# Patient Record
Sex: Male | Born: 1960 | Race: Black or African American | Hispanic: No | Marital: Married
Health system: Southern US, Community
[De-identification: ages and names within clinical notes are randomized; demographics above are authoritative.]

---

## 2001-12-13 ENCOUNTER — Encounter: Payer: Self-pay | Admitting: Internal Medicine

## 2001-12-13 ENCOUNTER — Encounter: Admission: RE | Admit: 2001-12-13 | Discharge: 2001-12-13 | Payer: Self-pay | Admitting: Internal Medicine

## 2002-01-07 ENCOUNTER — Encounter: Admission: RE | Admit: 2002-01-07 | Discharge: 2002-01-07 | Payer: Self-pay | Admitting: Internal Medicine

## 2002-01-07 ENCOUNTER — Encounter: Payer: Self-pay | Admitting: Internal Medicine

## 2003-11-11 ENCOUNTER — Emergency Department (HOSPITAL_COMMUNITY): Admission: EM | Admit: 2003-11-11 | Discharge: 2003-11-11 | Payer: Self-pay | Admitting: Emergency Medicine

## 2005-03-04 ENCOUNTER — Observation Stay (HOSPITAL_COMMUNITY): Admission: EM | Admit: 2005-03-04 | Discharge: 2005-03-05 | Payer: Self-pay | Admitting: Emergency Medicine

## 2005-03-28 ENCOUNTER — Encounter (INDEPENDENT_AMBULATORY_CARE_PROVIDER_SITE_OTHER): Payer: Self-pay | Admitting: Cardiology

## 2005-03-28 ENCOUNTER — Ambulatory Visit (HOSPITAL_COMMUNITY): Admission: RE | Admit: 2005-03-28 | Discharge: 2005-03-28 | Payer: Self-pay | Admitting: Internal Medicine

## 2007-05-02 ENCOUNTER — Inpatient Hospital Stay (HOSPITAL_COMMUNITY): Admission: EM | Admit: 2007-05-02 | Discharge: 2007-05-04 | Payer: Self-pay | Admitting: Emergency Medicine

## 2007-05-02 ENCOUNTER — Ambulatory Visit: Payer: Self-pay | Admitting: Cardiology

## 2007-05-03 ENCOUNTER — Encounter (INDEPENDENT_AMBULATORY_CARE_PROVIDER_SITE_OTHER): Payer: Self-pay | Admitting: *Deleted

## 2007-05-15 ENCOUNTER — Ambulatory Visit: Payer: Self-pay | Admitting: Cardiology

## 2009-01-04 IMAGING — CR DG CHEST 1V PORT
1 series · 1 of 1 positions shown · non-contrast
Comparison: 03/04/05

CLINICAL DATA: Heart palpitations. 
 PORTABLE CHEST - 1 VIEW ? 05/02/07 AT 6489 HOURS:

[AP]
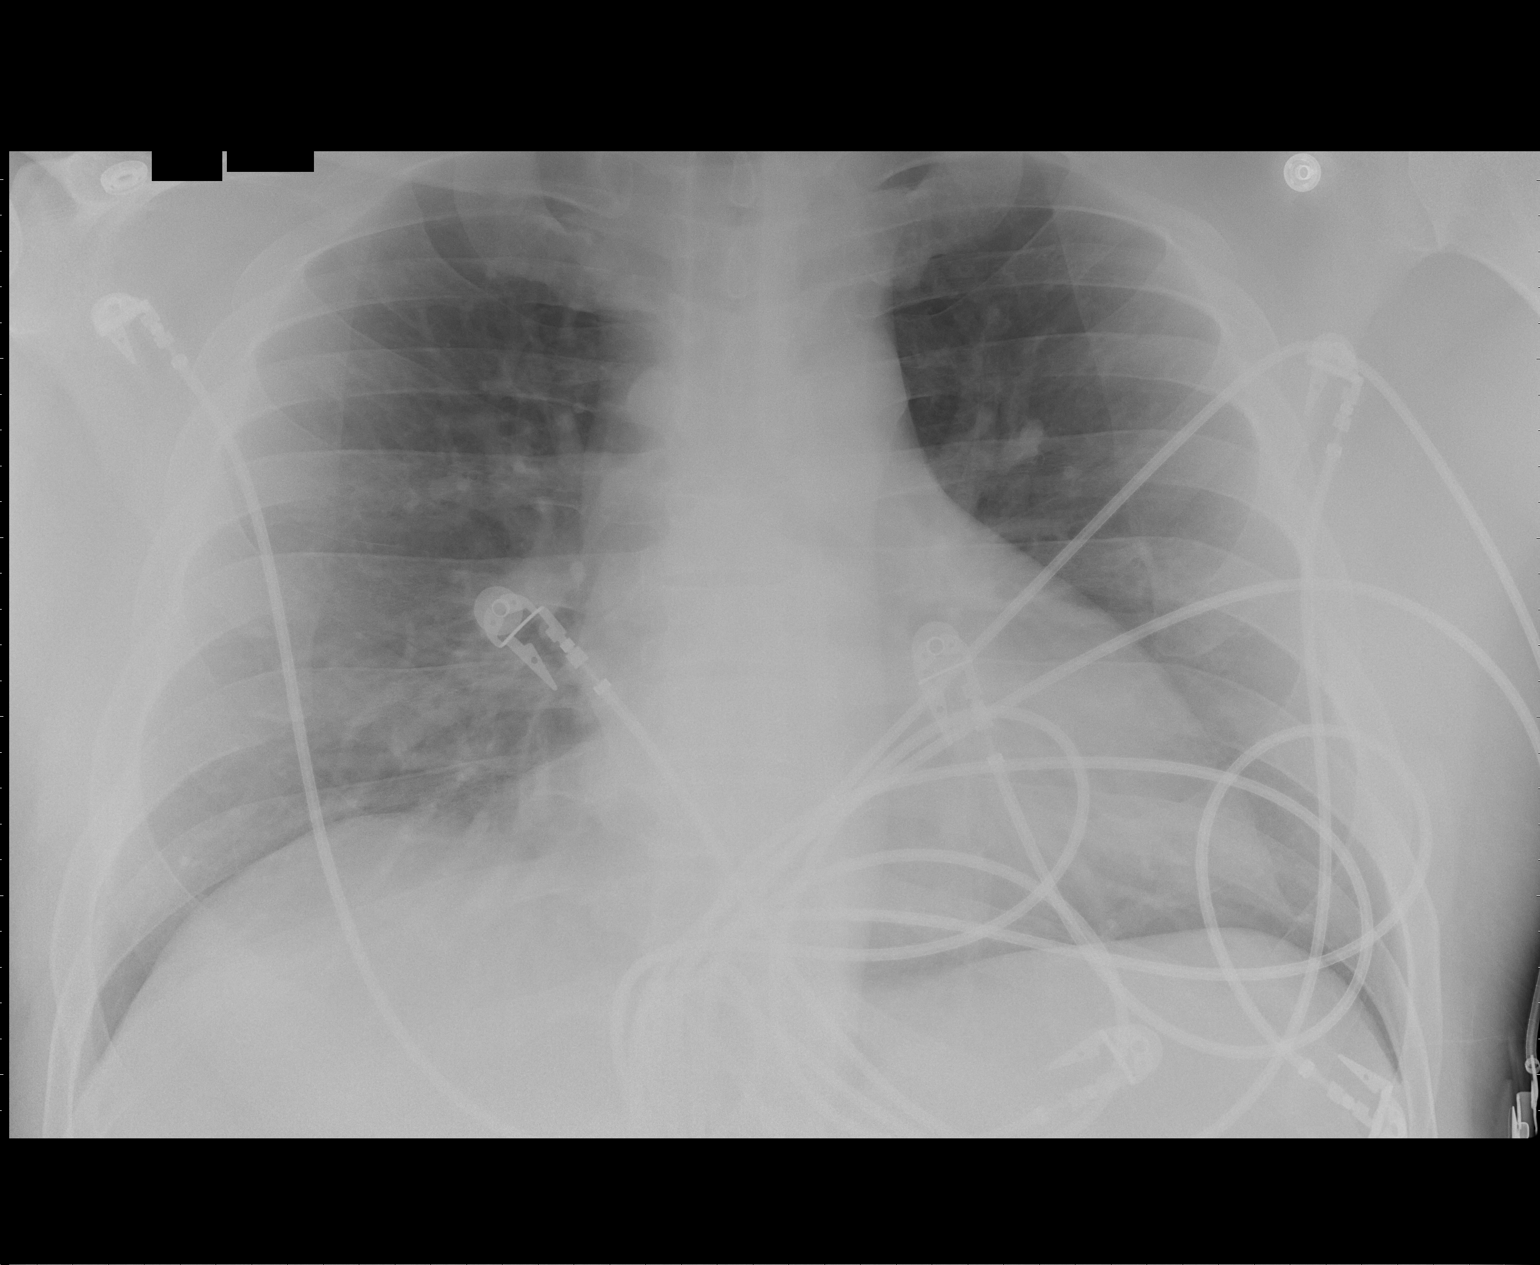

[1 of 1 positions shown; findings below may reference images not displayed]

FINDINGS: The heart is enlarged. There is vascular congestion without edema. There is no infiltrate or effusion.
IMPRESSION: Cardiac enlargement with mild vascular congestion.

## 2009-03-17 ENCOUNTER — Observation Stay (HOSPITAL_COMMUNITY): Admission: EM | Admit: 2009-03-17 | Discharge: 2009-03-18 | Payer: Self-pay | Admitting: Emergency Medicine

## 2009-03-24 ENCOUNTER — Encounter: Payer: Self-pay | Admitting: Cardiology

## 2009-04-02 DIAGNOSIS — E785 Hyperlipidemia, unspecified: Secondary | ICD-10-CM | POA: Insufficient documentation

## 2009-04-02 DIAGNOSIS — I4891 Unspecified atrial fibrillation: Secondary | ICD-10-CM | POA: Insufficient documentation

## 2009-04-02 DIAGNOSIS — C61 Malignant neoplasm of prostate: Secondary | ICD-10-CM

## 2009-04-14 ENCOUNTER — Ambulatory Visit: Payer: Self-pay | Admitting: Cardiology

## 2009-04-14 ENCOUNTER — Encounter: Payer: Self-pay | Admitting: Physician Assistant

## 2009-04-14 ENCOUNTER — Telehealth: Payer: Self-pay | Admitting: Cardiology

## 2009-04-14 ENCOUNTER — Telehealth (INDEPENDENT_AMBULATORY_CARE_PROVIDER_SITE_OTHER): Payer: Self-pay | Admitting: *Deleted

## 2009-04-14 DIAGNOSIS — I10 Essential (primary) hypertension: Secondary | ICD-10-CM

## 2009-04-15 ENCOUNTER — Encounter (HOSPITAL_COMMUNITY): Admission: RE | Admit: 2009-04-15 | Discharge: 2009-06-23 | Payer: Self-pay | Admitting: Cardiology

## 2009-04-15 ENCOUNTER — Ambulatory Visit: Payer: Self-pay

## 2009-04-15 ENCOUNTER — Ambulatory Visit: Payer: Self-pay | Admitting: Cardiology

## 2009-04-15 ENCOUNTER — Encounter: Payer: Self-pay | Admitting: Internal Medicine

## 2009-04-16 ENCOUNTER — Telehealth: Payer: Self-pay | Admitting: Cardiology

## 2009-04-19 ENCOUNTER — Inpatient Hospital Stay (HOSPITAL_COMMUNITY): Admission: RE | Admit: 2009-04-19 | Discharge: 2009-04-20 | Payer: Self-pay | Admitting: Urology

## 2009-04-19 ENCOUNTER — Encounter (INDEPENDENT_AMBULATORY_CARE_PROVIDER_SITE_OTHER): Payer: Self-pay | Admitting: Urology

## 2009-05-07 ENCOUNTER — Encounter: Payer: Self-pay | Admitting: Cardiology

## 2009-05-11 ENCOUNTER — Ambulatory Visit: Payer: Self-pay | Admitting: Cardiology

## 2009-05-11 DIAGNOSIS — E669 Obesity, unspecified: Secondary | ICD-10-CM

## 2010-04-21 NOTE — Assessment & Plan Note (Signed)
Summary: Cardiology Nuclear Study  Nuclear Med Background Indications for Stress Test: Evaluation for Ischemia, Surgical Clearance  Indications Comments: Pending robotic prostatectomy on 04/19/09 by Dr.Lester Laverle Patter  History: Echo, GXT  History Comments: '09 Echo:EF= 55-60%; 10/10 GXT:OK per patient; h/o PAF  Symptoms: Chest Tightness  Symptoms Comments: Last episode of ZO:XWRUEAVWU.   Nuclear Pre-Procedure Cardiac Risk Factors: History of Smoking, Lipids, NIDDM, Obesity Caffeine/Decaff Intake: None NPO After: 11:00 PM Lungs: Clear IV 0.9% NS with Angio Cath: 20g     IV Site: (L) AC IV Started by: Irean Hong RN Chest Size (in) 52     Height (in): 73.5 Weight (lb): 268 BMI: 35.00 Tech Comments: Quit smoking 03/20/09. No medications taken today.  Nuclear Med Study 1 or 2 day study:  1 day     Stress Test Type:  Stress Reading MD:  Olga Millers, MD     Referring MD:  Rollene Rotunda, MD; JW:JXBJYN Laverle Patter, MD, Larey Dresser, MD Resting Radionuclide:  Technetium 27m Tetrofosmin     Resting Radionuclide Dose:  11.0 mCi  Stress Radionuclide:  Technetium 84m Tetrofosmin     Stress Radionuclide Dose:  33.0 mCi   Stress Protocol Exercise Time (min):  5:30 min     Max HR:  142 bpm     Predicted Max HR:  174 bpm  Max Systolic BP: >243 mm Hg     Percent Max HR:  81.61 %     METS: 7.0 Rate Pressure Product:  0    Stress Test Technologist:  Rea [employer] CMA-N     Nuclear Technologist:  Burna Mortimer Deal RT-N  Rest Procedure  Myocardial perfusion imaging was performed at rest 45 minutes following the intravenous administration of Myoview Technetium 28m Tetrofosmin.  Stress Procedure  The patient exercised for 5:30.  The patient stopped due to a hypertensive response, >243/80; he did not take any of his medications this morning. He denied any chest pain.  The EKG was nondiagnostic due to baseline changes.  Myoview was injected at peak exercise and myocardial perfusion imaging was performed  after a brief delay.  QPS Raw Data Images:  Acuisition technically good; normal left ventricular size. Stress Images:  There is normal uptake in all areas. Rest Images:  Normal homogeneous uptake in all areas of the myocardium. Subtraction (SDS):  No evidence of ischemia. Transient Ischemic Dilatation:  1.06  (Normal <1.22)  Lung/Heart Ratio:  .32  (Normal <0.45)  Quantitative Gated Spect Images QGS EDV:  118 ml QGS ESV:  52 ml QGS EF:  56 % QGS cine images:  Normal wall motion.   Overall Impression  Exercise Capacity: Fair exercise capacity. BP Response: Hypertensive blood pressure response. Clinical Symptoms: No chest pain ECG Impression: No significant ST segment change suggestive of ischemia. Overall Impression: There is no sign of scar or ischemia.  Appended Document: Cardiology Nuclear Study NL.  Patient is at acceptable risk for the planned surgery.  Appended Document: Cardiology Nuclear Study talked with patient--talked with Larita Fife at Dr Rolly Salter office

## 2010-04-21 NOTE — Progress Notes (Signed)
Summary: Nuclear pre-procedure  Phone Note Outgoing Call   Call placed by: Milana Na, EMT-P,  April 14, 2009 1:23 PM Summary of Call: Left message with information on Myoview Information Sheet (see scanned document for details).         Nuclear Med Background Indications for Stress Test: Evaluation for Ischemia, Surgical Clearance  Indications Comments: Pending robotic prostatectomy Dr.Lester Borden  History: Echo  History Comments: '09 ECHO 55-60%     Nuclear Pre-Procedure Cardiac Risk Factors: Lipids, NIDDM  Nuclear Med Study Referring MD:  J.Hochrein

## 2010-04-21 NOTE — Assessment & Plan Note (Signed)
Summary: 1 month rov/sl   Visit Type:  Follow-up Primary Provider:  Dr. Zenaida Deed  CC:  HTN.  History of Present Illness: The patient presents for followup of his blood pressure. At the last visit we saw him preoperatively prior to getting prostatectomy and on the local hernia repair. He had a stress perfusion study which demonstrated no evidence of ischemia or infarct. However, he had a very dramatic blood pressure rise with exercise. He did well with his surgeries in recovery. Unfortunately he is not taking his [place]. His blood pressure is elevated as listed below. He does not like taking medications. He does not want to be tied to these prominently.  He would rather take over-the-counter therapies and lose weight. He is not having any cardiovascular symptoms and denies any chest pressure, neck or arm discomfort. He has had no palpitations, presyncope or syncope. He has had no PND or orthopnea. He has gained weight since I last saw him.  Current Medications (verified): 1)  Simvastatin 20 Mg Tabs (Simvastatin) .... Hold 2)  Xanax 0.5 Mg Tabs (Alprazolam) .... Take One As Needed 3)  Aspirin 81 Mg Tbec (Aspirin) .... Take One Daily 4)  Cartia Xt 120 Mg Xr24h-Cap (Diltiazem Hcl Coated Beads) .... Hold  Allergies (verified): No Known Drug Allergies  Past History:  Past Medical History: Reviewed history from 04/02/2009 and no changes required.  1. Atrial fibrillation.   2. Prostate cancer.   3. Hyperlipidemia.   4. Anxiety disorder.   Past Surgical History: Prostatectomy, Umbilical hernia repair  Review of Systems       As stated in the HPI and negative for all other systems.   Vital Signs:  Patient profile:   50 year old male Height:      73.5 inches Weight:      275 pounds BMI:     35.92 Pulse rate:   101 / minute Resp:     16 per minute BP sitting:   173 / 97  (right arm)  Vitals Entered By: Marrion Coy, CNA (May 11, 2009 2:30 PM)  Physical Exam  General:   Well developed, well nourished, in no acute distress. Head:  normocephalic and atraumatic Eyes:  PERRLA/EOM intact; conjunctiva and lids normal. Mouth:  Teeth, gums and palate normal. Oral mucosa normal. Neck:  Neck supple, no JVD. No masses, thyromegaly or abnormal cervical nodes. Chest Wall:  no deformities or breast masses noted Lungs:  Clear bilaterally to auscultation and percussion. Abdomen:  Bowel sounds positive; abdomen soft and non-tender without masses, organomegaly, or hernias noted. No hepatosplenomegaly. Msk:  Back normal, normal gait. Muscle strength and tone normal. Extremities:  No clubbing or cyanosis. Neurologic:  Alert and oriented x 3. Skin:  Intact without lesions or rashes. Cervical Nodes:  no significant adenopathy Inguinal Nodes:  no significant adenopathy Psych:  Normal affect.   Detailed Cardiovascular Exam  Neck    Carotids: Carotids full and equal bilaterally without bruits.      Neck Veins: Normal, no JVD.    Heart    Inspection: no deformities or lifts noted.      Palpation: normal PMI with no thrills palpable.      Auscultation: regular rate and rhythm, S1, S2 without murmurs, rubs, gallops, or clicks.    Vascular    Abdominal Aorta: no palpable masses, pulsations, or audible bruits.      Femoral Pulses: normal femoral pulses bilaterally.      Pedal Pulses: normal pedal pulses bilaterally.  Radial Pulses: normal radial pulses bilaterally.      Peripheral Circulation: no clubbing, cyanosis, or edema noted with normal capillary refill.     Impression & Recommendations:  Problem # 1:  HYPERTENSION, BENIGN (ICD-401.1) I had a long discussion with him about the risk of uncontrolled blood pressure. We discussed at length the lifestyle changes that can improve his blood pressure. However, I think I have convinced him that at least for now he also needs medications. He agrees to restart the [place]. He agrees to get a blood pressure cuff so that we  can further understand how well controlled his pressure is. In the meantime he will limit alcohol salt and lose weight.  Problem # 2:  OBESITY, UNSPECIFIED (ICD-278.00) As above he understands the need to lose weight.  Problem # 3:  ATRIAL FIBRILLATION (ICD-427.31) He has had no symptomatic recurrence of this. No further testing is indicated.  Patient Instructions: 1)  Your physician recommends that you schedule a follow-up appointment in: 4 months with Dr Antoine Poche 2)  Your physician recommends that you continue on your current medications as directed. Please refer to the Current Medication list given to you today. 3)  You have been diagnosed with atrial fibrillation.  Atrial fibrillation is a condition in which one of the upper chambers of the heart has extra electrical cells causing it to beat very fast.  Please see the handout/brochure given to you today for further information. Prescriptions: CARTIA XT 120 MG XR24H-CAP (DILTIAZEM HCL COATED BEADS) HOLD  #30 x 11   Entered by:   Charolotte Capuchin, RN   Authorized by:   Rollene Rotunda, MD, [facility]   Signed by:   Charolotte Capuchin, RN on 05/11/2009   Method used:   Electronically to        [facility] (retail)       [address]       [place], [place]  [address]       Ph: [phone]       Fax: [phone]   RxID:   [id number] SIMVASTATIN 20 MG TABS (SIMVASTATIN) HOLD  #30 x 11   Entered by:   Charolotte Capuchin, RN   Authorized by:   Rollene Rotunda, MD, [facility]   Signed by:   Charolotte Capuchin, RN on 05/11/2009   Method used:   Electronically to        [facility] (retail)       [address]       [place], [place]  [address]       Ph: [phone]       Fax: [phone]   RxID:   [id number]

## 2010-04-21 NOTE — Miscellaneous (Signed)
  Clinical Lists Changes  Observations: Added new observation of NUCLEAR NOS: Exercise Capacity: Fair exercise capacity. BP Response: Hypertensive blood pressure response. Clinical Symptoms: No chest pain ECG Impression: No significant ST segment change suggestive of ischemia. Overall Impression: There is no sign of scar or ischemia.  (04/15/2009 13:49)      Nuclear Study  Procedure date:  04/15/2009  Findings:      Exercise Capacity: Fair exercise capacity. BP Response: Hypertensive blood pressure response. Clinical Symptoms: No chest pain ECG Impression: No significant ST segment change suggestive of ischemia. Overall Impression: There is no sign of scar or ischemia.

## 2010-04-21 NOTE — Letter (Signed)
Summary: [facility] Office Note  [facility] Office Note   Imported By: Roderic Ovens 04/20/2009 11:25:27  _____________________________________________________________________  External Attachment:    Type:   Image     Comment:   External Document

## 2010-04-21 NOTE — Progress Notes (Signed)
Summary: surgical clearance  Phone Note From Other Clinic   Caller: nurse Larita Fife Summary of Call: pt needs clearance for surgery. pt had stress test yesterday. ofc [id number] [phone] fax [phone] Initial call taken by: Edman Circle,  April 16, 2009 9:27 AM  Follow-up for Phone Call        myoview done 04-15-09--will review with Dr Bernette Redbird) Katina Dung, RN, BSN  April 16, 2009 9:42 AM   Additional Follow-up for Phone Call Additional follow up Details #1::        Patient is cleared for surgery. talked with Larita Fife at Dr Konrad Dolores Borden's  office [phone]--faxed 04-15-09  myoview with Dr Jorgeluis Gurganus's comments and office note 04-14-09 by Jacolyn Reedy  to Dr Laverle Patter

## 2010-04-21 NOTE — Assessment & Plan Note (Signed)
Summary: surgical clearacne/prostate cancer/jml   Visit Type:  Pre-op Evaluation  CC:  no complaints.  History of Present Illness: This is a 50 year old, African American male, patient, who is here for presurgical clearance. He is scheduled to undergo prostatectomy next Monday. he is already scheduled for a stress Myoview tomorrow.  The patient was seen here 2 years ago after an episode of paroxysmal atrial fibrillation. This was brought on by emotional stress. He had one other episode that occurred under extreme physical activity. He was placed on Cartia. The patient never followed up and took this medication for a very short time.  In December. The patient was shoveling his neighbors driveway and became short of breath. He thought he was going back into atrial fibrillation and actually went to the emergency room. Discharge summary also states he had atypical chest pain. Cardiac enzymes were normal and he was in normal sinus rhythm.  The patient is treated for hyperlipidemia, but has had elevated blood pressures recently that have not been treated. The patient does not like to take chronic medications and feels he can get his blood pressure down. If he starts an exercise program and lose weight. He did quit smoking cigarettes on March 21, 1999.  The patient denies any further chest pain, palpitations, dyspnea, dyspnea on exertion, dizziness, or presyncope.  Current Medications (verified): 1)  Simvastatin 20 Mg Tabs (Simvastatin) .... Take One Daily 2)  Xanax 0.5 Mg Tabs (Alprazolam) .... Take One As Needed 3)  Aspirin 81 Mg Tbec (Aspirin) .... Take One Daily  Past History:  Past Medical History: Last updated: 04/02/2009  1. Atrial fibrillation.   2. Prostate cancer.   3. Hyperlipidemia.   4. Anxiety disorder.   Past Surgical History: Last updated: 04/02/2009  The patient has no history of surgeries.  Family History: Reviewed history from 04/02/2009 and no changes  required. The patient's grandfather had coronary artery disease.    he does not know his father's history Mother had breast cancer, but no cardiac problem  Social History: Reviewed history from 04/02/2009 and no changes required. The patient is a smoker, has been smoking 1 pack per   day for the last 18 years.  Patient quit smoking March 20, 2009 Only drinks 3 to 4 beers a week.  No history   of illicit drug abuse  Review of Systems       See history of present illness  Vital Signs:  Patient profile:   50 year old male Height:      73.5 inches Weight:      268 pounds BMI:     35.00 Pulse rate:   80 / minute Pulse rhythm:   regular BP sitting:   148 / 100  (left arm)  Vitals Entered By: Jacquelin Hawking, CMA (April 14, 2009 11:59 AM)  Physical Exam  General:   Well-nournished, in no acute distress. Neck: No JVD, HJR, Bruit, or thyroid enlargement Lungs: No tachypnea, clear without wheezing, rales, or rhonchi Cardiovascular: RRR, PMI not displaced, heart sounds normal, no murmurs, gallops, bruit, thrill, or heave. Abdomen: BS normal. Soft without organomegaly, masses, lesions or tenderness. Extremities: without cyanosis, clubbing or edema. Good distal pulses bilateral SKin: Warm, no lesions or rashes  Musculoskeletal: No deformities Neuro: no focal signs    EKG  Procedure date:  04/14/2009  Findings:      normal sinus rhythm with nonspecific ST-T wave changes. No acute change  Impression & Recommendations:  Problem # 1:  PRE-OPERATIVE CARDIOVASCULAR EXAMINATION (  ICD-V72.81) Patient has history of 2 episodes of it or fibrillation. He has not been maintained on [place]. Cardiac risk factors are significant for untreated hypertension, treated, hyperlipidemia, long-time smoker, but quit March 20, 2009, no history of diabetes mellitus or immediate family history of coronary artery disease. I discussed this patient with Dr. Charlies Constable.he agrees we should resume cartia  120 mg daily today for blood pressure control and he is scheduled for a stress Myoview tomorrow. If this is normal. He can proceed with surgery. His updated medication list for this problem includes:    Aspirin 81 Mg Tbec (Aspirin) .Marland Kitchen... Take one daily    Cartia Xt 120 Mg Xr24h-cap (Diltiazem hcl coated beads) .Marland Kitchen... Take one tablet by mouth daily  Orders: EKG w/ Interpretation (93000)  Problem # 2:  ATRIAL FIBRILLATION (ICD-427.31) Patient is in normal sinus rhythm today. Nancie Neas has been resumed. His updated medication list for this problem includes:    Aspirin 81 Mg Tbec (Aspirin) .Marland Kitchen... Take one daily  Problem # 3:  HYPERTENSION, BENIGN (ICD-401.1) we started Cartia His updated medication list for this problem includes:    Aspirin 81 Mg Tbec (Aspirin) .Marland Kitchen... Take one daily    Cartia Xt 120 Mg Xr24h-cap (Diltiazem hcl coated beads) .Marland Kitchen... Take one tablet by mouth daily  Problem # 4:  PROSTATE CANCER (ICD-185) Patient scheduled for surgery Monday  Problem # 5:  HYPERLIPIDEMIA (ICD-272.4) Treated by his primary care physician His updated medication list for this problem includes:    Simvastatin 20 Mg Tabs (Simvastatin) .Marland Kitchen... Take one daily  Patient Instructions: 1)  Your physician recommends that you schedule a follow-up appointment in: 1 month with Dr. Antoine Poche. 2)  Your physician has requested that you limit the intake of sodium (salt) in your diet to two grams daily. Please see [facility] handout. 3)  Your physician has recommended you make the following change in your medication: New medication: Cartia 120 mg take one tablet by mouth once a day. Prescriptions: CARTIA XT 120 MG XR24H-CAP (DILTIAZEM HCL COATED BEADS) take one tablet by mouth daily  #30 x 6   Entered by:   Ollen Gross, RN, BSN   Authorized by:   Marletta Lor, PA-C   Signed by:   Ollen Gross, RN, BSN on 04/14/2009   Method used:   Electronically to        [facility] (retail)       [address]       [place], [place]  [address]       Ph: [phone]       Fax: [phone]   RxID:   [id number]

## 2010-04-21 NOTE — Progress Notes (Signed)
Summary: has some questions   Phone Note Call from Patient Call back at Home Phone [phone]   Caller: Patient Reason for Call: Talk to Nurse Summary of Call: has med question for Herma Carson Initial call taken by: Migdalia Dk,  April 14, 2009 3:12 PM  Follow-up for Phone Call        [facility] Ollen Gross, RN, BSN  April 15, 2009 9:48 AM LMOM TO CALL BACK PAN FLEMING-HAYES,RN NEEDED TO KNOW IF HE SHOULD HAVE TAKEN CARTIA BEFORE HIS STRESS TEST.  HAD IT THIS AM Follow-up by: Charolotte Capuchin, RN,  April 15, 2009 11:28 AM

## 2010-06-06 LAB — BASIC METABOLIC PANEL
BUN: 14 mg/dL (ref 6–23)
CO2: 29 mEq/L (ref 19–32)
Chloride: 101 mEq/L (ref 96–112)
Creatinine, Ser: 1.12 mg/dL (ref 0.4–1.5)
Potassium: 4 mEq/L (ref 3.5–5.1)

## 2010-06-06 LAB — CBC
HCT: 48.8 % (ref 39.0–52.0)
MCHC: 32.7 g/dL (ref 30.0–36.0)
MCV: 86.6 fL (ref 78.0–100.0)
Platelets: 204 10*3/uL (ref 150–400)
RBC: 5.64 MIL/uL (ref 4.22–5.81)

## 2010-06-06 LAB — HEMOGLOBIN AND HEMATOCRIT, BLOOD: Hemoglobin: 15.3 g/dL (ref 13.0–17.0)

## 2010-06-06 LAB — ABO/RH: ABO/RH(D): A POS

## 2010-06-09 LAB — BASIC METABOLIC PANEL
BUN: 8 mg/dL (ref 6–23)
Calcium: 8.4 mg/dL (ref 8.4–10.5)
Chloride: 104 mEq/L (ref 96–112)
Creatinine, Ser: 1.36 mg/dL (ref 0.4–1.5)
GFR calc Af Amer: >60 mL/min (ref >60)
GFR calc non Af Amer: 56 mL/min — ABNORMAL LOW (ref >60)

## 2010-06-09 LAB — CARDIAC PANEL(CRET KIN+CKTOT+MB+TROPI)
Total CK: 256 U/L — ABNORMAL HIGH (ref 7–232)
Troponin I: 0.01 ng/mL (ref 0.00–0.06)

## 2010-06-09 LAB — HEMOGLOBIN AND HEMATOCRIT, BLOOD
HCT: 41.9 % (ref 39.0–52.0)
Hemoglobin: 13.9 g/dL (ref 13.0–17.0)

## 2010-06-20 LAB — COMPREHENSIVE METABOLIC PANEL
AST: 21 U/L (ref 0–37)
Albumin: 3.5 g/dL (ref 3.5–5.2)
BUN: 11 mg/dL (ref 6–23)
Calcium: 9.1 mg/dL (ref 8.4–10.5)
Creatinine, Ser: 1.02 mg/dL (ref 0.4–1.5)
GFR calc Af Amer: >60 mL/min (ref >60)
Total Bilirubin: 0.4 mg/dL (ref 0.3–1.2)
Total Protein: 6 g/dL (ref 6.0–8.3)

## 2010-06-20 LAB — CK TOTAL AND CKMB (NOT AT ARMC)
CK, MB: 4.3 ng/mL — ABNORMAL HIGH (ref 0.3–4.0)
CK, MB: 4.4 ng/mL — ABNORMAL HIGH (ref 0.3–4.0)
Relative Index: 2.1 (ref 0.0–2.5)
Relative Index: 2.2 (ref 0.0–2.5)
Relative Index: 2.4 (ref 0.0–2.5)
Total CK: 196 U/L (ref 7–232)
Total CK: 201 U/L (ref 7–232)

## 2010-06-20 LAB — DIFFERENTIAL
Basophils Absolute: 0 10*3/uL (ref 0.0–0.1)
Eosinophils Relative: 0 % (ref 0–5)
Lymphocytes Relative: 24 % (ref 12–46)
Lymphs Abs: 1 10*3/uL (ref 0.7–4.0)
Monocytes Absolute: 0.3 10*3/uL (ref 0.1–1.0)
Neutro Abs: 3 10*3/uL (ref 1.7–7.7)

## 2010-06-20 LAB — CBC
HCT: 45.6 % (ref 39.0–52.0)
HCT: 46.8 % (ref 39.0–52.0)
Hemoglobin: 15.6 g/dL (ref 13.0–17.0)
MCHC: 33.2 g/dL (ref 30.0–36.0)
MCHC: 34.3 g/dL (ref 30.0–36.0)
MCV: 87.1 fL (ref 78.0–100.0)
Platelets: 171 10*3/uL (ref 150–400)
RBC: 5.22 MIL/uL (ref 4.22–5.81)
RDW: 14.2 % (ref 11.5–15.5)
WBC: 4.3 10*3/uL (ref 4.0–10.5)

## 2010-06-20 LAB — POCT CARDIAC MARKERS
CKMB, poc: 1.4 ng/mL (ref 1.0–8.0)
CKMB, poc: 1.8 ng/mL (ref 1.0–8.0)
Troponin i, poc: <0.05 ng/mL (ref 0.00–0.09)
Troponin i, poc: <0.05 ng/mL (ref 0.00–0.09)

## 2010-06-20 LAB — TROPONIN I: Troponin I: 0.01 ng/mL (ref 0.00–0.06)

## 2010-08-02 NOTE — H&P (Signed)
NAME:  Chad Maldonado, GRANDFIELD NO.:  [id number]   MEDICAL RECORD NO.:  [id number]          PATIENT TYPE:  INP   LOCATION:  4703                         FACILITY:  [facility]   PHYSICIAN:  Beckey Rutter, MD  DATE OF BIRTH:  1960-10-13   DATE OF ADMISSION:  05/02/2007  DATE OF DISCHARGE:                              HISTORY & PHYSICAL   PRIMARY CARE PHYSICIAN:  Dr. Burton Apley.   CHIEF COMPLAINT:  Palpitation.   HISTORY OF PRESENT ILLNESS:  This is 50 year old, pleasant, African-  American male with past medical history significant for paroxysmal  atrial fibrillation, hypertension and borderline diabetes presented  today with palpitation started since midnight.  The patient is working  third shift, and during his work, he noticed palpitation on and off.  When he got off work this morning, the palpitations came along with some  shortness of breath.  That is when he contacted his doctor and  eventually came to the emergency department.  Other than the shortness  of breath, there is no significant association with this palpitation.  Categorically, the patient denied chest pain, sweating or severe  dizziness.   PAST MEDICAL HISTORY:  1. History of paroxysmal atrial fibrillation with history of      hospitalization on December 2006.  2. Hypertension.  3. Borderline diabetes, now his diabetes controlled with diet and      lifestyle modification.  4. Dyslipidemia.   FAMILY HISTORY:  Diabetes in the father's side.   SOCIAL HISTORY:  The patient is a heavy smoker for some time now.  He  smokes one and a half pack of cigarettes per day.  He denied illicit  drug abuse at this time, and he denied ethanol abuse.   MEDICATION ALLERGIES:  Not known to have medication allergy.   MEDICATION:  He is not taking any medication at this time.   REVIEW OF SYSTEMS:  Shortness of breath, otherwise noncontributory.   PHYSICAL EXAMINATION:  His blood pressure is 151/94, temperature  97.7,  pulse 80, respiratory rate is 20.  On exam, he is alert, oriented x3,  giving history.  HEAD:  Atraumatic, normocephalic.  EYES:  PERRLA.  MOUTH:  Moist.  No  ulcer.  NECK:  Supple.  No JVD.  LUNGS:  Bilateral diminished air entry.  PRECORDIUM:  Distal heart sounds with irregular rhythm.  ABDOMEN:  Soft, nontender.  Bowel sounds present.  EXTREMITIES:  No lower extremity edema.  NEUROLOGICALLY:  Alert, oriented x3, and giving history.   His EKG done today showing ventricular rate of 94 with atrial  fibrillation and nonspecific lateral T-waves and one EKG showing V5/V6  inversion of the T-waves.  The troponin is showing less than 0.05,  myoglobin is 50.3, CK-MB is 1.7.  Creatinine is 1.4.  Hemoglobin is  18.4, hematocrit is 54.  Sodium is 138, potassium 4.4, chloride 109,  glucose 89, BUN is 17.  Chest x-ray showing cardiac enlargement with  vascular congestion and no edema.   ASSESSMENT AND PLAN:  This is a 50 year old, African-American male with  a history of hypertension and history of paroxysmal atrial  fibrillation,  who presented today with what seems to be at atrial fibrillation.  The  patient is not compliant with his medication, though he mentioned he had  some workup done, he could not remember his cardiologist's name.  He  also is not taking medication currently including the Cardizem to  control the rate as well as the hypertension.   1. The patient will be admitted to telemetry floor for further      assessment and management.  2. Will obtain 2-D echo.  3. Will cycle cardiac enzymes, and we will obtain EKG every 8 hours to      rule out acute coronary syndromes.  4. We will check his thyroid function.  5. We will do drug screen.  6. I will start the patient on Cardizem drip for rate control and will      try to change it to Cardizem p.o. if the rate is controlled by the      drip.  7. We will check his lipid profile.  8. We will check his hemoglobin A1c and  will continue him on CBG for      further glycemic control.  9. The patient is counseled for his tobacco abuse, and he thinks it is      about time for him to quit.  We will provide the patient with      smoking cessation materials during hospitalization.  10.The patient will be started on Lovenox therapeutic dose until the      rest of the workup comes back and until he is ruled out.  11.For GI prophylaxis, I will start the patient on Protonix.  I will      have low threshold for cardiology consultation depending on the      outcome of the result on the workup.      Beckey Rutter, MD  Electronically Signed     EME/MEDQ  D:  05/02/2007  T:  05/04/2007  Job:  [id number]   cc:   Burton Apley, M.D.

## 2010-08-02 NOTE — Discharge Summary (Signed)
NAME:  ROCHELLE, NEPHEW NO.:  [id number]   MEDICAL RECORD NO.:  [id number]          PATIENT TYPE:  INP   LOCATION:  4703                         FACILITY:  [facility]   PHYSICIAN:  Beckey Rutter, MD  DATE OF BIRTH:  [birth year]   DATE OF ADMISSION:  05/02/2007  DATE OF DISCHARGE:  05/04/2007                               DISCHARGE SUMMARY   PRIMARY CARE PHYSICIAN:  Antony Madura, M.D.   CHIEF COMPLAINT AND HISTORY OF PRESENT ILLNESS:  This is 50 year old  pleasant African-American male with past medical history significant for  paroxysmal atrial fibrillation presented with palpitations, and found to  be in atrial fibrillation with rapid ventricular response.  Please refer  to the full H&P dictated on the day of admission for further details.   HOSPITAL COURSE:  Problem #1:  During hospitalization the patient was  admitted to telemetry floor, and he was ruled out for acute coronary  syndromes.  He had his thyroid function checked.  The patient his been  stable for the last 24-hour on monitoring.  He converted to sinus after  Cardizem, orally, was started for rate control, and he remained in  sinus.  The patient had an appointment scheduled with the cardiology  service on May 15, 2007.   Problem #3:  Smoking cessation.  The patient was counseled, and he  stated that he is not quite ready to totally quit, but he is really  going to put some effort towards smoking cessation.  The smoking  cessation material will be provided to him before in his discharge.   DISCHARGE DIAGNOSES:  1. Paroxysmal atrial fibrillation likely low on antral fibrillation  2. Borderline hypertension.  3. Ongoing tobacco abuse.  4. Dyslipidemia.   DISCHARGE MEDICATIONS:  1. Cardizem 120 mg continuous release daily.  2. Aspirin 325 mg daily.   HOSPITAL PROCEDURES:  1. A 2-D echo done on the May 03, 2007.  Summary reading:      Overall left ventricular systolic function was  normal.  Left      ventricular ejection fraction was estimated range between 55-60%.      There was no diagnostic evidence of left ventricular regional wall      motion abnormalities.  2. Left ventricular wall thickness was mildly increased, and the left      atrium was mildly dilated.  Hemoglobin A1c on May 02, 2007,      was 6.4.  TSH is 1.6.  Lipid profile done on May 02, 2007:      Cholesterol is 222, triglyceride 93, HDL 36, LDL is 167.  Drug      screen is negative except for tetrahydrocannabinol.   DISCHARGE/PLAN:  The patient will be discharged to follow up with his  primary physician Dr. Su Hilt.  He also was recommended to follow up  with cardiology as discussed with him.  He did not want to start a small  dose of  statins for his cholesterol; and I agreed to leave him without  medication at this time, especially when he is thinking on focusing on  lifestyle modification.  The patient has a borderline blood pressure  during admission time, and I would continue him on Cardizem orally, to  be reevaluated as per the primary physician's discretion.      Beckey Rutter, MD  Electronically Signed     EME/MEDQ  D:  05/04/2007  T:  05/06/2007  Job:  [id number]   cc:   Antony Madura, M.D.

## 2010-08-02 NOTE — Assessment & Plan Note (Signed)
[facility]                            CARDIOLOGY OFFICE NOTE   NAME:WRIGHTVern, Guerette                     MRN:          [id number]  DATE:05/15/2007                            DOB:          [birth year]    PRIMARY CARE PHYSICIAN:  Dr. Zenaida Deed.   REASON FOR PRESENTATION:  Evaluate this patient with atrial  fibrillation.   HISTORY OF PRESENT ILLNESS:  This patient is a 50 year old gentleman  with a history of atrial fibrillation.  This was first documented in  2006.  He says he has had three episodes over that time.  The last of  these was February 12.  He feels his heart racing and cannot get his  breath.  He was admitted on February 12.  He received some Cardizem and  converted to sinus rhythm.  There were no ischemic EKG changes.  No  enzyme elevations.  He did have an echocardiogram that demonstrated well-  preserved ventricular function.  There may be some mild left ventricular  hypertrophy.  He has had no further palpitations since that time.  Has  had no presyncope or syncope.  Denies any shortness of breath otherwise.  He has had no PND or orthopnea.  Has had no chest, neck or arm  discomfort.  He says he brought these events on when he has been  emotionally stressed or when he is trying to exert himself much too much  at the gym.  He has jumped in exercise without warming up or without  being prepared.  That was what happened last event.   PAST MEDICAL HISTORY:  1. Paroxysmal atrial fibrillation.  2. Borderline diabetes.  3. Borderline hypertension,.  4. Tobacco abuse.  5. Dyslipidemia.   PAST SURGICAL HISTORY:  None.   ALLERGIES:  None.   MEDICATIONS:  1. Cartia 120 mg daily.  2. Xanax.  3. Aspirin 325 mg daily.   SOCIAL HISTORY:  The patient works at [employer].  He is married.  Has  four children ages 64, 66, 73, and 21.  He smoked one and one-half packs  a day for 20 years.  He continues to smoke.  He occasionally drinks  alcohol.   FAMILY HISTORY:  Noncontributory for early coronary artery disease.   REVIEW OF SYSTEMS:  As stated in the HPI and negative for other systems.   PHYSICAL EXAMINATION:  GENERAL:  The patient is in no distress.  Blood  pressure 149/82 (repeated 128/88), heart rate 94 and regular, weight 160  pounds, body mass index 34.  HEENT:  Eyelids are unremarkable.  Pupils equal, round, reactive to  light.  Fundi not visualized, oral mucosa unremarkable.  NECK:  No jugular distention at 45 degrees.  Carotid upstroke brisk and  symmetric, no bruits, thyromegaly.  LYMPHATICS:  No cervical, axillary, inguinal adenopathy.  LUNGS:  Clear to auscultation bilaterally.  BACK:  No costovertebral angle tenderness.  CHEST:  Unremarkable.  HEART:  PMI not displaced or sustained, S1-S2 within normal limits.  No  S3, no S4, no clicks, rubs, murmurs.  ABDOMEN:  Obese, positive bowel sounds.  Normal in  frequency and pitch,  no bruits, rebound, guarding or midline pulsatile mass.  No  hepatomegaly, no splenomegaly.  SKIN:  No rashes.  No nodules.  EXTREMITIES:  Pulses 2+ throughout, no edema, cyanosis or clubbing.  NEUROLOGICAL:  Oriented person, place, time, cranial nerves II-XII  grossly intact, motor grossly intact.   EKG sinus rhythm, rate 93, axis within normal limits, intervals within  normal limits, no acute ST wave change.   ASSESSMENT/PLAN:  1. Atrial fibrillation.  The patient has paroxysmal atrial      fibrillation.  It has always been brought on by some kind of      stress.  He is now on [place].  We are going to see how he does      symptomatically.  He will let me know if he has any recurrent      palpitations at which point we might consider therapy such as      flecainide pill in pocket approach.  There is no indications for      Coumadin.  He will remain on the aspirin.  2. Obesity.  I spent quite a bit time discussing this with him and      counseling him on weight loss.  3.  Tobacco.  We spent greater than 3 minutes discussing options.  He      thinks he will try to quit cold [place].  4. Hypertension.  Blood pressure is borderline.  Nancie Neas is a good      choice.  He certainly needs weight loss and therapeutic lifestyle      changes.  Again, we talked about this at length.  5. Follow-up.  I will see him in 6 months or sooner if needed.     Rollene Rotunda, MD, [facility]  Electronically Signed    JH/MedQ  DD: 05/15/2007  DT: 05/16/2007  Job #: [id number]   cc:   Antony Madura, M.D.

## 2010-08-05 NOTE — Discharge Summary (Signed)
NAME:  Chad Maldonado, Chad Maldonado NO.:  [id number]   MEDICAL RECORD NO.:  [id number]          PATIENT TYPE:  INP   LOCATION:  3704                         FACILITY:  [facility]   PHYSICIAN:  Lonia Blood, M.D.       DATE OF BIRTH:  08-Nov-1960   DATE OF ADMISSION:  03/04/2005  DATE OF DISCHARGE:  03/05/2005                                 DISCHARGE SUMMARY   PRIMARY CARE PHYSICIAN:  Antony Madura, M.D.   ADMISSION DIAGNOSES:  1.  Paroxysmal atrial fibrillation, resolved.  2.  Hypertension.  3.  Obesity.  4.  Possible obstructive sleep apnea.   DISCHARGE MEDICATIONS:  1.  Diltiazem sustained release at 120 mg p.o. daily.  2.  Alprazolam 0.25 mg p.r.n. as needed for anxiety, as previously      prescribed by Dr. Su Hilt.   PROCEDURE DURING THIS ADMISSION:  No procedures were done.   CONSULTATIONS:  No consultations were obtained.   HISTORY:  For admission H&P please refer to the H&P done by Dr. Hillery Aldo on March 04, 2005.  Briefly, Chad Maldonado is a 50 year old African-  American male without any significant past medical history who presented to  [facility] Emergency Room for acute onset of palpitations while he  was watching TV.  He was found to be in atrial fibrillation with rapid  ventricular response and he was admitted for further workup and observation.   HOSPITAL COURSE:  For the problem of paroxysmal atrial fibrillation with  rapid ventricular response, Chad Maldonado was admitted to a telemetry bed and he  was started on a Cardizem drip.  Overnight he converted to a normal sinus  rhythm and he was transitioned to oral Cardizem.  His three sets of cardiac  markers were normal. Notably, his troponin was 0.02 and less than 0.05.  Also his repeated troponin was 0.01.  His complete metabolic profile was  within normal limits as well as his TSH which was 1.107 within normal  limits.  By performing a complete history it seems like Chad Maldonado has been  under a  tremendous amount of stress recently, and this could have well been  contributing to his paroxysmal onset of atrial fibrillation.  Other  possibilities are untreated hypertensive disease and Chad Maldonado was advised  to continue taking Cardizem at the dose of 120 mg daily.  Also Chad Maldonado  displays symptoms that are consistent with obstructive sleep apnea and we  would suggest a sleep study as an outpatient to rule these out.   At the time of discharge Chad Maldonado was in good condition.  He was alert,  oriented, in normal sinus rhythm without any symptoms.  He was instructed to  followup with Dr. Burton Apley for a hospital followup visit and the  possibility of a sleep study scheduled.      Lonia Blood, M.D.  Electronically Signed     SL/MEDQ  D:  03/05/2005  T:  03/07/2005  Job:  [id number]   cc:   Antony Madura, M.D.  Fax: [phone]

## 2010-08-05 NOTE — H&P (Signed)
NAME:  Chad Maldonado, CARD NO.:  [id number]   MEDICAL RECORD NO.:  [id number]          PATIENT TYPE:  INP   LOCATION:  3704                         FACILITY:  [facility]   PHYSICIAN:  Hillery Aldo, M.D.   DATE OF BIRTH:  1961/02/09   DATE OF ADMISSION:  03/04/2005  DATE OF DISCHARGE:  03/05/2005                                HISTORY & PHYSICAL   PRIMARY CARE PHYSICIAN:  Dr. Burton Apley.   CHIEF COMPLAINT:  Palpitations.   HISTORY OF PRESENT ILLNESS:  The patient is a 50 year old male who was  watching TV this evening when he experienced the sudden onset of chest  palpitations at approximately 4:30 p.m. The palpitations did not subside  after an hour so he presented to a [facility] office. He was evaluated by a  physician there and given a sublingual nitroglycerin and 20 milligrams of  Cardizem IV while being transported via EMS to the emergency department. The  Cardizem did bring his heart rate down into the 80s and resolved the  fluttering. The patient reported some shortness of breath with the  fluttering sensations but no chest pain. He began to experience the  fluttering again while in emergency department and was noted on telemetry to  have a heart rate back up into the upper 90s. He is admitted for further  evaluation and workup.   ALLERGIES:  No known drug allergies.   MEDICATIONS:  The patient claims he is not on any regular medications,  herbal medicines or over-the-counter medicines, but has been prescribed  Lipitor, Xanax, Nexium and Flonase in the past.   PAST MEDICAL HISTORY:  1.  Impaired fasting glucose.  2.  Hyperlipidemia   The patient has no history of surgeries.   FAMILY HISTORY:  The patient's mother is alive at 71 and has had breast  cancer. The patient's father is alive at 47 and suffers from diabetes. His  siblings and children are healthy.   SOCIAL HISTORY:  The patient is married. He works as a Architect at [employer]. He smokes greater than a pack of tobacco daily, and  frequently drinks socially and uses occasional marijuana. Denies any cocaine  use. He does drink several cups of coffee a day.   REVIEW OF SYSTEMS:  No fever or chills. No shortness of breath except with  his onset of palpitations tonight. No chest pain. No cough. No changes in  his bowel habits, melena or hematochezia. No dysuria. No headaches. No  history of seizures. He has had some fatigue, but relates that he has not  been sleeping more than a few hours a night for the past six weeks.   PHYSICAL EXAM:  VITAL SIGNS:  Temperature 98.7, pulse 96, respirations 17,  blood pressure 147/88.  GENERAL:  Obese male in no acute distress.  HEENT:  Normocephalic, atraumatic. PERRL. EOMI. Oropharynx is clear with  moist mucous membranes. Sclerae not icteric.  NECK:  Supple. No thyromegaly, no lymphadenopathy, no jugular venous  distension, no carotid bruits.  CHEST/LUNGS:  Clear to auscultation bilaterally with good air movement.  HEART:  Heart sounds are regular tachycardia. PMI is nondisplaced.  ABDOMEN:  Soft, nontender, nondistended. Normoactive bowel sounds.  EXTREMITIES:  No clubbing, edema, cyanosis.  SKIN:  Warm and dry. No rashes.  NEUROLOGIC:  The patient is alert and oriented x3. Moves all extremities x4  with equal strength. Cranial nerves II-XII grossly intact. Nonfocal.   DATA REVIEWED:  EKG shows atrial fibrillation with RVR. Chest x-ray shows no  acute cardiopulmonary disease.   LABORATORY DATA:  CBC shows a white blood cell count of 5.8, hemoglobin  16.3, hematocrit 47.7 and platelet 203,000. Absolute neutrophil count 4.0.  Sodium is 140, potassium 4.0, chloride 109, bicarbonate 27, BUN 10,  creatinine 1.1, glucose 145, total bilirubin 0.7, alkaline phosphatase 60,  AST 21, ALT 21, alkaline phosphatase 5.8, albumin 8.5, calcium 8.7. PT is  13.5, PTT 35. Point of care markers are negative with a myoglobin of 84,  CK-  MB is 2.7, and a troponin I less than 0.05.   ASSESSMENT/PLAN:  1.  New onset atrial fibrillation with rapid ventricular response: We will      admit the patient to telemetry. We will rule out acute myocardial      infarction by serial enzymes and electrocardiogram. We will check 2-D      echocardiogram. We will check a thyroid-stimulating hormones and urine      drug screen to rule out hyperthyroidism or illicit substance abuse as      cause of atrial fibrillation. We will obtain cardiac consultation the      morning. We will also put the patient on long-acting p.o. Cardizem and      use IV Cardizem as needed to control his rate.  2.  Hyperlipidemia:  The patient has a history of hyperlipidemia and has      been prescribed Statin therapy in the past but has not ever taken it. We      will check a fasting lipid panel in morning.  3.  Tobacco abuse:  We will counsel the patient on tobacco cessation.  4.  Prophylaxis:  We will initiate gastrointestinal and deep vein thrombosis      prophylaxis.           ______________________________  Hillery Aldo, M.D.     CR/MEDQ  D:  03/04/2005  T:  03/05/2005  Job:  [id number]   cc:   Antony Madura, M.D.  Fax: [phone]

## 2010-11-20 IMAGING — CR DG CHEST 1V PORT
1 series · 1 of 1 positions shown · non-contrast
Comparison: 05/02/2007

CLINICAL DATA: Chest pain

PORTABLE CHEST - 1 VIEW

[AP]
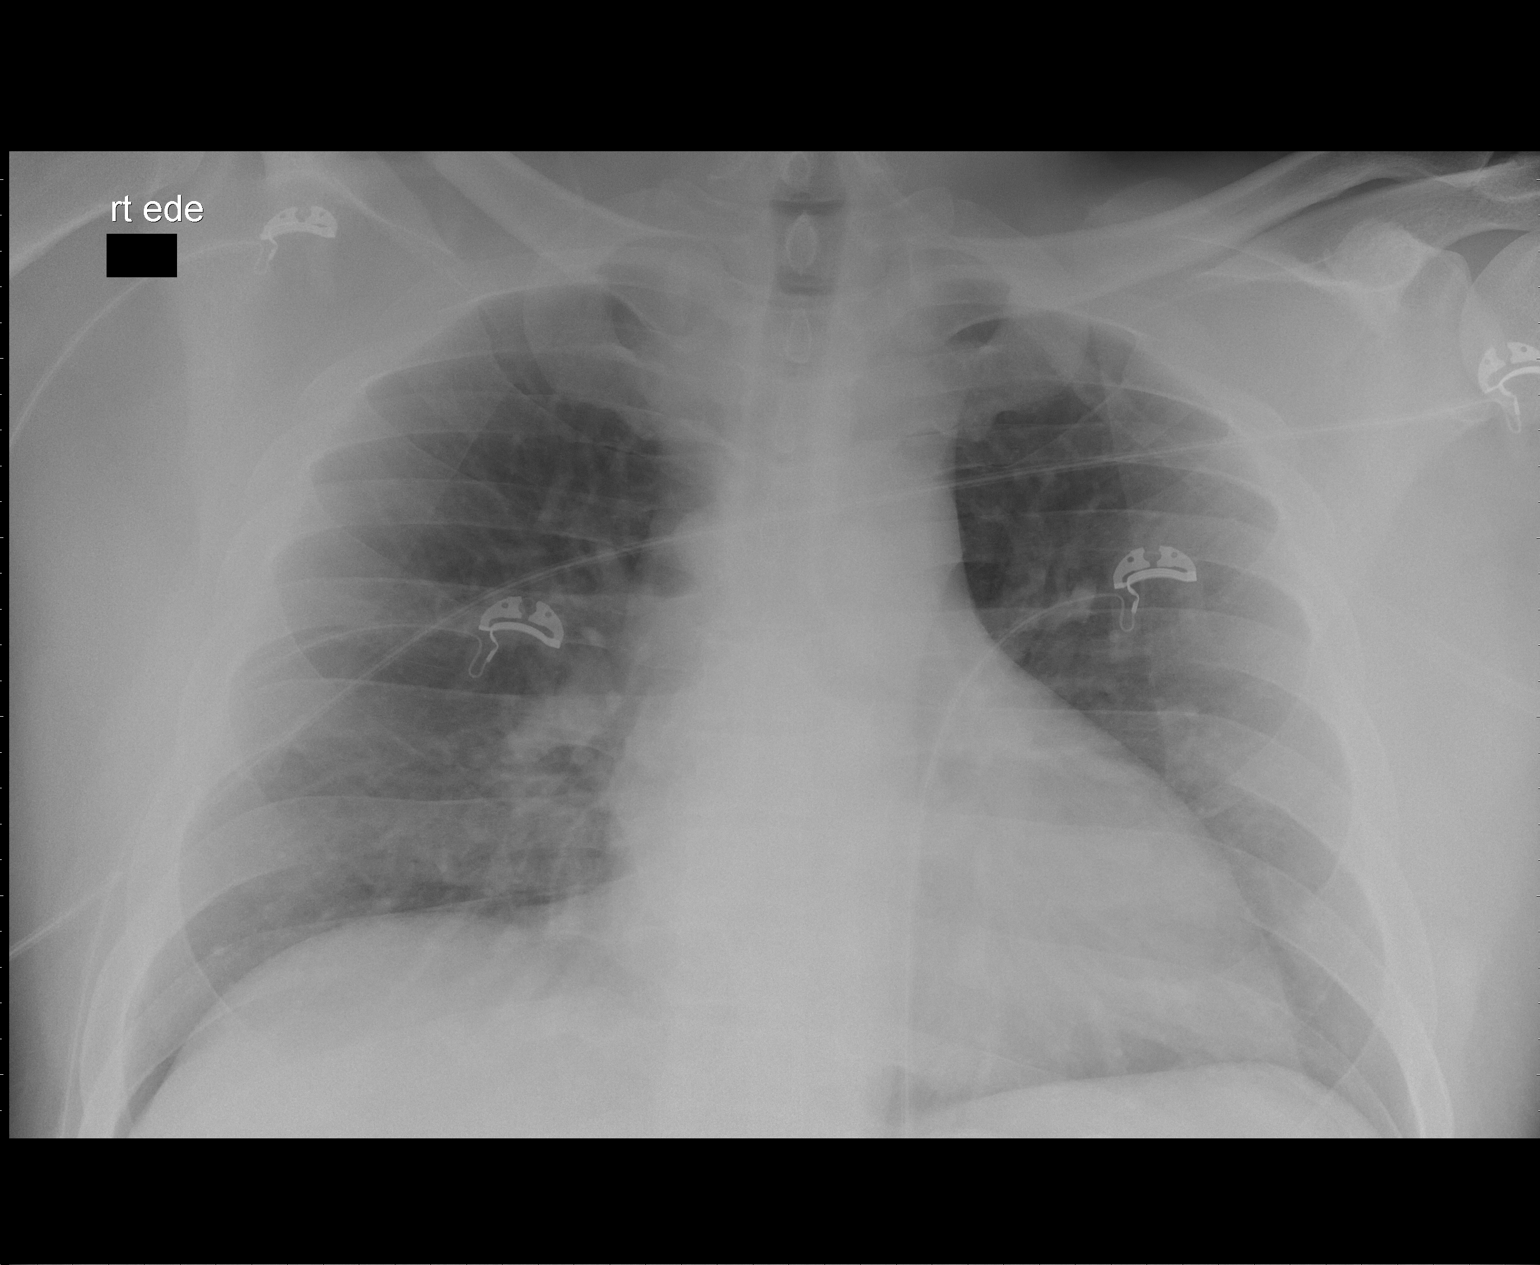

[1 of 1 positions shown; findings below may reference images not displayed]

FINDINGS: The heart is upper normal in size.  Lungs are under
aerated and clear.  No pneumothorax.
IMPRESSION: No active cardiopulmonary disease.

## 2010-12-09 LAB — RAPID URINE DRUG SCREEN, HOSP PERFORMED
Amphetamines: NOT DETECTED
Barbiturates: NOT DETECTED
Tetrahydrocannabinol: POSITIVE — AB

## 2010-12-09 LAB — CBC
Hemoglobin: 15.4
MCV: 86.7
Platelets: 186
RBC: 5.3
RBC: 5.52
WBC: 5.8

## 2010-12-09 LAB — POCT CARDIAC MARKERS
CKMB, poc: 1.7
Myoglobin, poc: 50.3
Operator id: 198171
Troponin i, poc: <0.05

## 2010-12-09 LAB — CARDIAC PANEL(CRET KIN+CKTOT+MB+TROPI)
CK, MB: 3.3
CK, MB: 3.4
Relative Index: 1
Relative Index: 1.1
Total CK: 250 — ABNORMAL HIGH
Total CK: 262 — ABNORMAL HIGH
Total CK: 276 — ABNORMAL HIGH
Troponin I: 0.03

## 2010-12-09 LAB — COMPREHENSIVE METABOLIC PANEL
ALT: 18
Calcium: 8.7
GFR calc Af Amer: >60
Glucose, Bld: 91
Sodium: 141
Total Protein: 5.9 — ABNORMAL LOW

## 2010-12-09 LAB — DIFFERENTIAL
Eosinophils Absolute: 0.1
Lymphs Abs: 2.7
Monocytes Relative: 6
Neutrophils Relative %: 47

## 2010-12-09 LAB — PROTIME-INR
INR: 1.1
Prothrombin Time: 14.1

## 2010-12-09 LAB — I-STAT 8, (EC8 V) (CONVERTED LAB)
BUN: 17
Bicarbonate: 26.1 — ABNORMAL HIGH
Glucose, Bld: 87
TCO2: 27
pCO2, Ven: 44.3 — ABNORMAL LOW
pH, Ven: 7.377 — ABNORMAL HIGH

## 2010-12-09 LAB — BASIC METABOLIC PANEL
GFR calc non Af Amer: >60
Potassium: 4.5
Sodium: 139

## 2010-12-09 LAB — APTT: aPTT: 34

## 2010-12-09 LAB — CALCIUM: Calcium: 8.5

## 2010-12-09 LAB — CK TOTAL AND CKMB (NOT AT ARMC)
CK, MB: 4.1 — ABNORMAL HIGH
Relative Index: 1
Total CK: 422 — ABNORMAL HIGH

## 2010-12-09 LAB — LIPID PANEL
Cholesterol: 222 — ABNORMAL HIGH
HDL: 36 — ABNORMAL LOW
Triglycerides: 93

## 2010-12-09 LAB — TSH: TSH: 1.66

## 2014-01-13 ENCOUNTER — Ambulatory Visit
Admission: RE | Admit: 2014-01-13 | Discharge: 2014-01-13 | Disposition: A | Discharge status: Home / Self Care | Payer: 59 | Source: Ambulatory Visit | Attending: Internal Medicine | Admitting: Internal Medicine

## 2014-01-13 ENCOUNTER — Other Ambulatory Visit: Payer: Self-pay | Admitting: Internal Medicine

## 2014-01-13 DIAGNOSIS — R05 Cough: Secondary | ICD-10-CM

## 2014-01-13 DIAGNOSIS — R059 Cough, unspecified: Secondary | ICD-10-CM

## 2014-01-20 ENCOUNTER — Other Ambulatory Visit: Payer: Self-pay | Admitting: Internal Medicine

## 2014-01-20 DIAGNOSIS — R9389 Abnormal findings on diagnostic imaging of other specified body structures: Secondary | ICD-10-CM

## 2014-01-21 ENCOUNTER — Ambulatory Visit
Admission: RE | Admit: 2014-01-21 | Discharge: 2014-01-21 | Disposition: A | Discharge status: Home / Self Care | Payer: 59 | Source: Ambulatory Visit | Attending: Internal Medicine | Admitting: Internal Medicine

## 2014-01-21 DIAGNOSIS — R9389 Abnormal findings on diagnostic imaging of other specified body structures: Secondary | ICD-10-CM

## 2014-01-21 MED ORDER — IOHEXOL 300 MG/ML  SOLN: 75.0000 mL | Freq: Once | INTRAMUSCULAR | Status: AC | PRN

## 2014-04-07 ENCOUNTER — Encounter: Payer: Self-pay | Admitting: Cardiology

## 2015-09-18 IMAGING — CR DG CHEST 2V
2 series · 2 of 2 positions shown · non-contrast
Comparison: 03/17/2009

CLINICAL DATA: 53-year-old male with Cough

EXAM:
CHEST - 2 VIEW

[w chest lat]
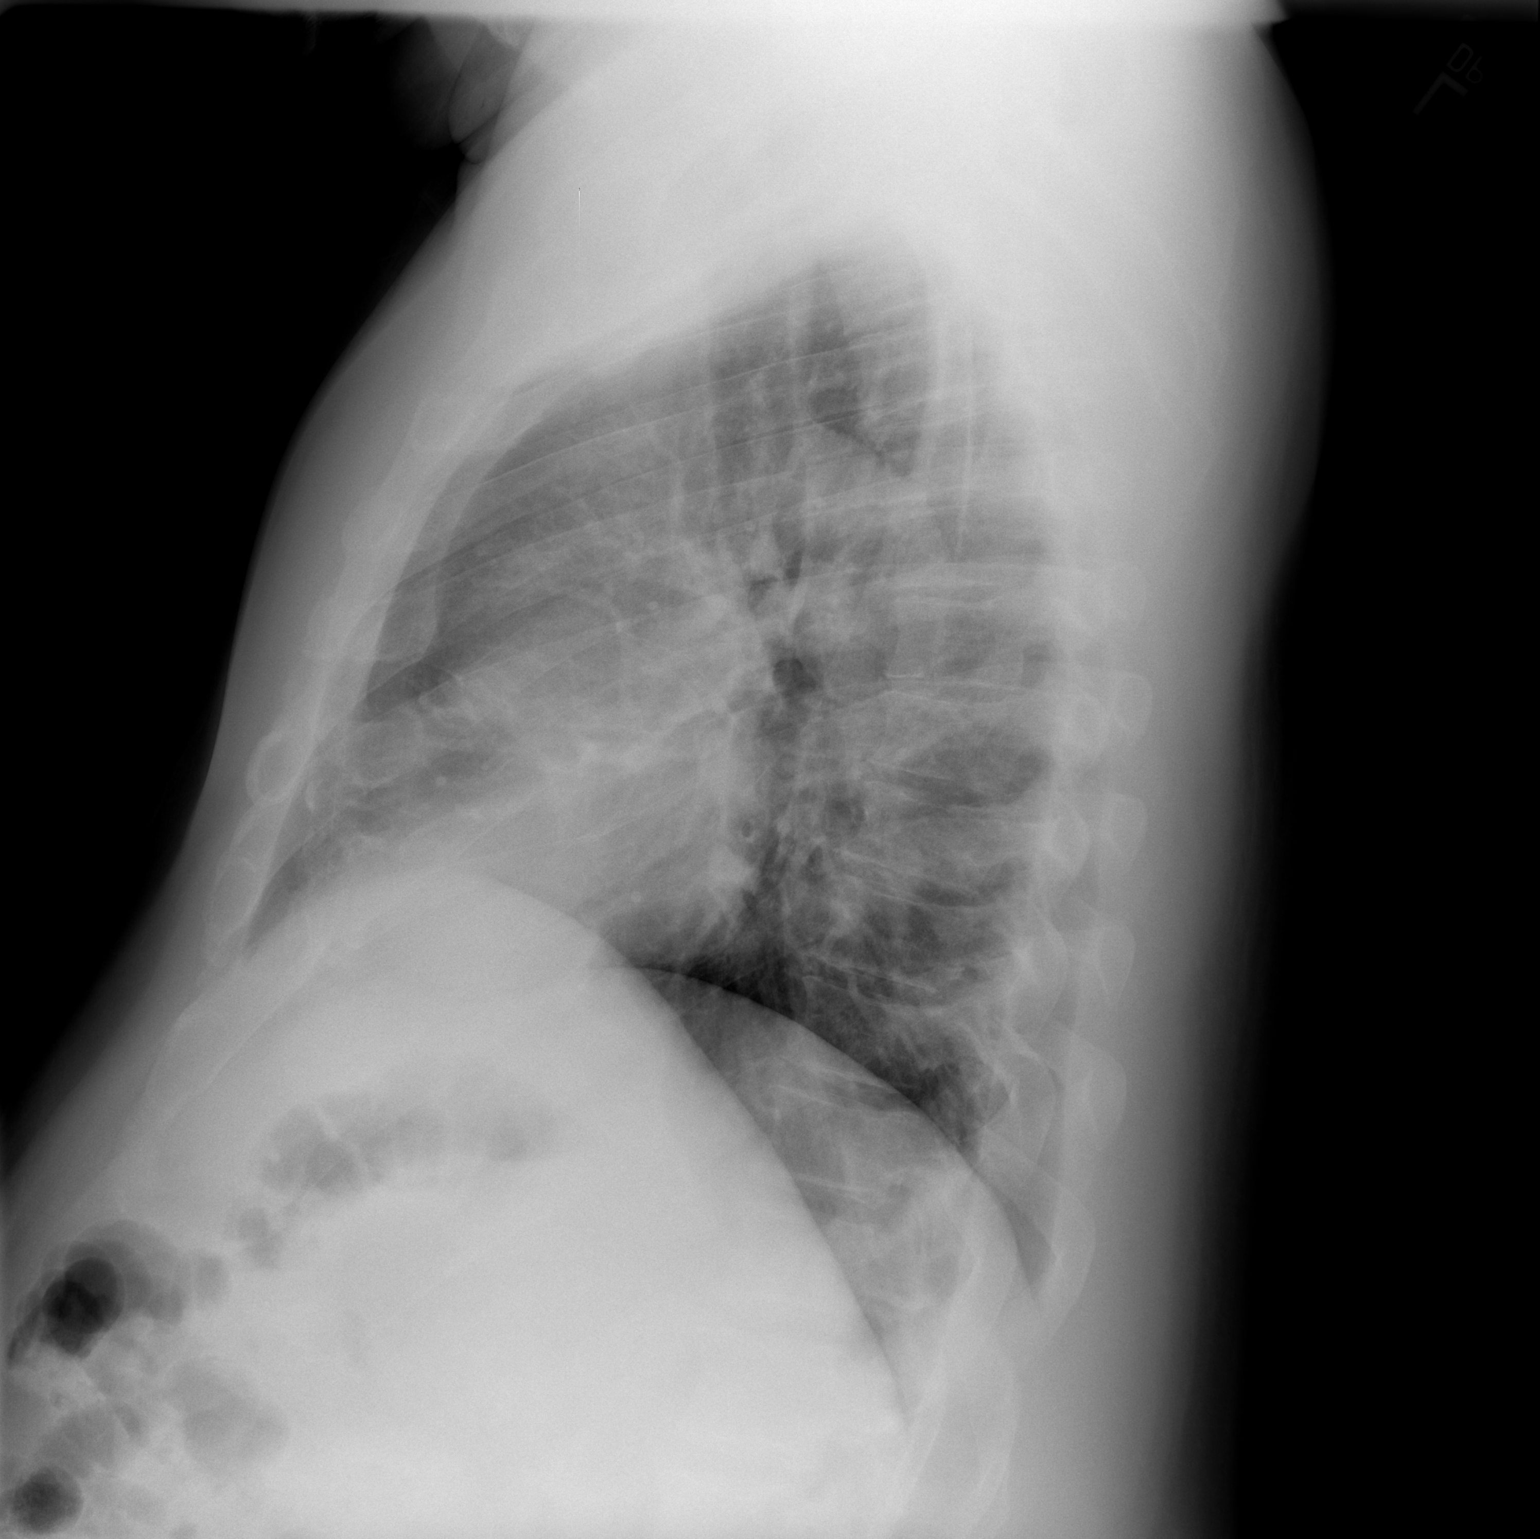

[w chest pa]
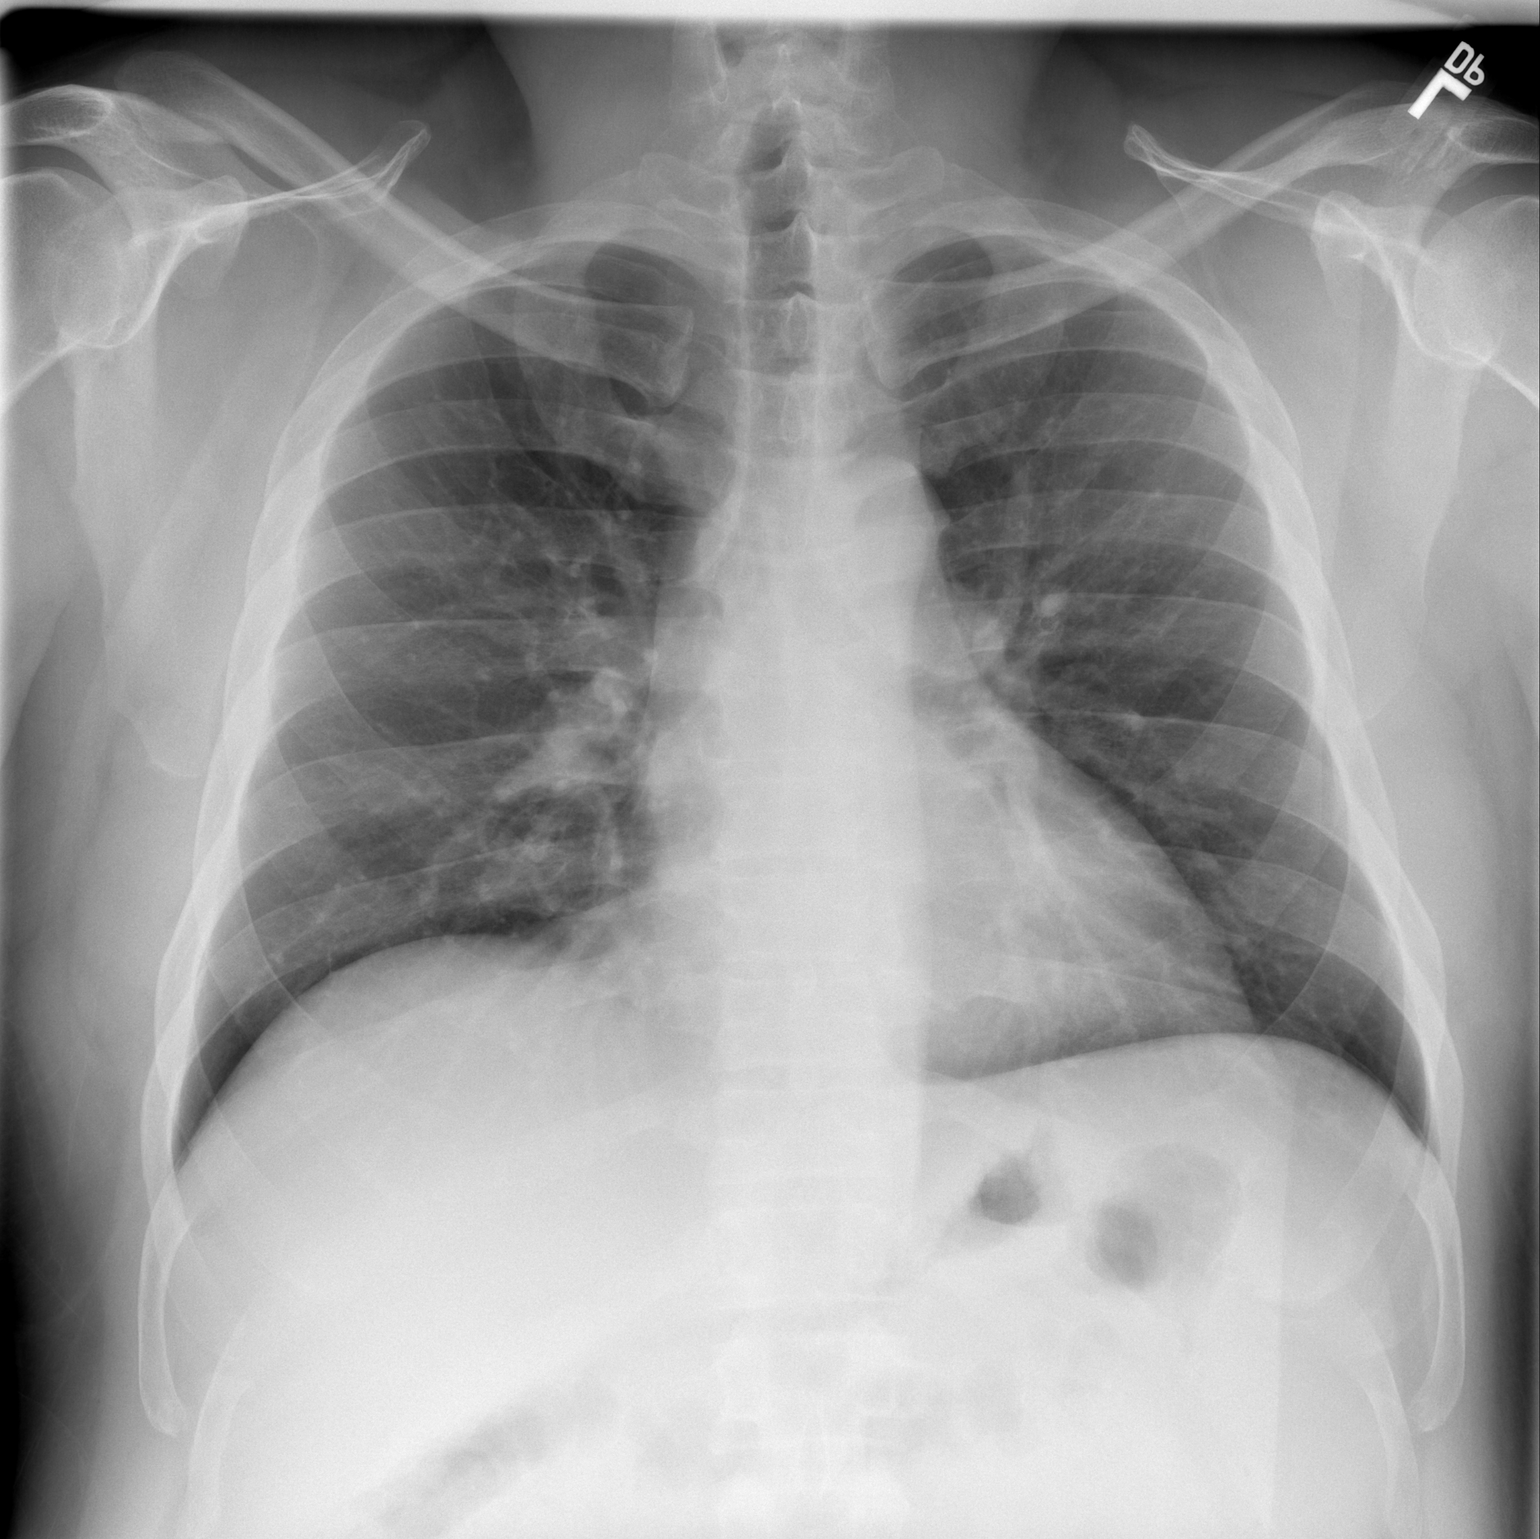

[2 of 2 positions shown; findings below may reference images not displayed]

FINDINGS: Cardiomediastinal silhouette projects within normal limits in size
and contour. No confluent airspace disease, pneumothorax, or pleural
effusion.

No displaced fracture.

Unremarkable appearance of the upper abdomen.

3 mm nodule of the left upper lobe is more conspicuous than on the
comparison plain film.
IMPRESSION: No radiographic evidence of acute cardiopulmonary disease.

3 mm nodule of the left upper lobe more conspicuous than on the
comparison. Nonemergent noncontrast chest CT recommended for further
evaluation.

## 2019-03-07 ENCOUNTER — Other Ambulatory Visit: Payer: Self-pay | Admitting: Cardiology

## 2019-03-07 DIAGNOSIS — Z20822 Contact with and (suspected) exposure to covid-19: Secondary | ICD-10-CM

## 2019-03-08 LAB — NOVEL CORONAVIRUS, NAA: SARS-CoV-2, NAA: NOT DETECTED

## 2023-08-08 DIAGNOSIS — E785 Hyperlipidemia, unspecified: Secondary | ICD-10-CM | POA: Diagnosis not present

## 2023-08-08 DIAGNOSIS — N529 Male erectile dysfunction, unspecified: Secondary | ICD-10-CM | POA: Diagnosis not present

## 2023-08-08 DIAGNOSIS — Z809 Family history of malignant neoplasm, unspecified: Secondary | ICD-10-CM | POA: Diagnosis not present

## 2023-08-08 DIAGNOSIS — I10 Essential (primary) hypertension: Secondary | ICD-10-CM | POA: Diagnosis not present

## 2023-08-08 DIAGNOSIS — E119 Type 2 diabetes mellitus without complications: Secondary | ICD-10-CM | POA: Diagnosis not present

## 2023-08-08 DIAGNOSIS — Z6829 Body mass index (BMI) 29.0-29.9, adult: Secondary | ICD-10-CM | POA: Diagnosis not present

## 2023-08-08 DIAGNOSIS — E663 Overweight: Secondary | ICD-10-CM | POA: Diagnosis not present

## 2023-08-08 DIAGNOSIS — Z833 Family history of diabetes mellitus: Secondary | ICD-10-CM | POA: Diagnosis not present

## 2023-08-08 DIAGNOSIS — Z720 Tobacco use: Secondary | ICD-10-CM | POA: Diagnosis not present

## 2023-08-08 DIAGNOSIS — Z7984 Long term (current) use of oral hypoglycemic drugs: Secondary | ICD-10-CM | POA: Diagnosis not present

## 2023-08-08 DIAGNOSIS — D6869 Other thrombophilia: Secondary | ICD-10-CM | POA: Diagnosis not present

## 2023-08-08 DIAGNOSIS — Z8546 Personal history of malignant neoplasm of prostate: Secondary | ICD-10-CM | POA: Diagnosis not present

## 2024-04-10 NOTE — Progress Notes (Signed)
 Chad Maldonado                                          MRN: [id number]   04/10/2024   The [facility] Quality Team Specialist reviewed this patient medical record for the purposes of chart review for care gap closure. The following were reviewed: chart review for care gap closure-controlling blood pressure and glycemic status assessment.    [facility] Quality Team
